# Patient Record
Sex: Female | Born: 1990 | Hispanic: No | Marital: Married | Smoking: Never smoker
Health system: Southern US, Community
[De-identification: ages and names within clinical notes are randomized; demographics above are authoritative.]

---

## 2013-09-10 ENCOUNTER — Encounter: Payer: Self-pay | Admitting: Obstetrics & Gynecology

## 2013-09-11 ENCOUNTER — Ambulatory Visit (INDEPENDENT_AMBULATORY_CARE_PROVIDER_SITE_OTHER): Payer: 59 | Admitting: Obstetrics & Gynecology

## 2013-09-11 ENCOUNTER — Encounter: Payer: Self-pay | Admitting: Obstetrics & Gynecology

## 2013-09-11 VITALS — BP 129/90 | HR 77 | Temp 98.0°F | Ht 66.0 in | Wt 125.0 lb

## 2013-09-11 DIAGNOSIS — N939 Abnormal uterine and vaginal bleeding, unspecified: Secondary | ICD-10-CM

## 2013-09-11 DIAGNOSIS — N949 Unspecified condition associated with female genital organs and menstrual cycle: Secondary | ICD-10-CM | POA: Insufficient documentation

## 2013-09-11 DIAGNOSIS — N926 Irregular menstruation, unspecified: Secondary | ICD-10-CM

## 2013-09-11 DIAGNOSIS — Z113 Encounter for screening for infections with a predominantly sexual mode of transmission: Secondary | ICD-10-CM

## 2013-09-11 NOTE — Progress Notes (Signed)
Subjective:     Kathy Hopkins is a 23 y.o. female here for a routine exam.  Current complaints: irregular menstrual cycle. [facility]. [facility]. Patient states her last period was irregular and lighter than normal. Patient states she is still currently bleeding and it is heavier. Patient states she is having some abdominal.  Personal health questionnaire reviewed: yes.   Gynecologic History Patient's last menstrual period was 08/21/2013. Contraception: none Last Pap: 2014. Results were: normal    The following portions of the patient's history were reviewed and updated as appropriate: allergies, current medications, past family history, past medical history, past social history, past surgical history and problem list.  Review of Systems Pertinent items are noted in HPI.   Objective:    BP 129/90  Pulse 77  Temp(Src) 98 F (36.7 C)  Ht 5\' 6"  (1.676 m)  Wt 125 lb (56.7 kg)  BMI 20.19 kg/m2  LMP 08/21/2013      General:  alert     Abdomen: soft, non-tender; bowel sounds normal; no masses,  no organomegaly   Vulva:  normal  Vagina: Scant menstrual blood  Cervix:  no lesions  Corpus: normal size, contour, position, consistency, mobility, non-tender  Adnexa:  normal adnexa   Informal U/S: normal uterus/ovaries Assessment:    AUB--likely O Pelvic pain--DDX--Mittelschmerz  Plan:   Orders Placed This Encounter  Procedures  . GC/Chlamydia Probe Amp  . B-HCG Quant  . TSH  . Prolactin  Keep menstrual calendar/diary Return in 2 months or if symptoms worsen

## 2013-09-11 NOTE — Patient Instructions (Signed)
  Abnormal Uterine Bleeding Abnormal uterine bleeding can affect women at various stages in life, including teenagers, women in their reproductive years, pregnant women, and women who have reached menopause. Several kinds of uterine bleeding are considered abnormal, including:  Bleeding or spotting between periods.   Bleeding after sexual intercourse.   Bleeding that is heavier or more than normal.   Periods that last longer than usual.  Bleeding after menopause.  Many cases of abnormal uterine bleeding are minor and simple to treat, while others are more serious. Any type of abnormal bleeding should be evaluated by your health care provider. Treatment will depend on the cause of the bleeding. HOME CARE INSTRUCTIONS Monitor your condition for any changes. The following actions may help to alleviate any discomfort you are experiencing:  Avoid the use of tampons and douches as directed by your health care provider.  Change your pads frequently. You should get regular pelvic exams and Pap tests. Keep all follow-up appointments for diagnostic tests as directed by your health care provider.  SEEK MEDICAL CARE IF:   Your bleeding lasts more than 1 week.   You feel dizzy at times.  SEEK IMMEDIATE MEDICAL CARE IF:   You pass out.   You are changing pads every 15 to 30 minutes.   You have abdominal pain.  You have a fever.   You become sweaty or weak.   You are passing large blood clots from the vagina.   You start to feel nauseous and vomit. MAKE SURE YOU:   Understand these instructions.  Will watch your condition.  Will get help right away if you are not doing well or get worse. Document Released: 07/31/2005 Document Revised: 04/02/2013 Document Reviewed: 02/27/2013 [facility] Patient Information 2014 [place], [place]. Place abnormal uterine bleeding patient instructions here.

## 2013-09-12 LAB — HCG, QUANTITATIVE, PREGNANCY: hCG, Beta Chain, Quant, S: <2 m[IU]/mL

## 2013-09-12 LAB — TSH: TSH: 0.99 u[IU]/mL (ref 0.350–4.500)

## 2013-09-12 LAB — GC/CHLAMYDIA PROBE AMP
CT PROBE, AMP APTIMA: NEGATIVE
GC Probe RNA: NEGATIVE

## 2013-09-12 LAB — PROLACTIN: PROLACTIN: 9.5 ng/mL

## 2013-11-06 ENCOUNTER — Ambulatory Visit: Payer: 59 | Admitting: Obstetrics & Gynecology

## 2014-08-10 ENCOUNTER — Encounter: Payer: Self-pay | Admitting: *Deleted

## 2014-08-11 ENCOUNTER — Encounter: Payer: Self-pay | Admitting: Obstetrics & Gynecology

## 2015-03-30 ENCOUNTER — Other Ambulatory Visit (HOSPITAL_COMMUNITY): Payer: Self-pay | Admitting: Obstetrics and Gynecology

## 2015-03-30 DIAGNOSIS — Z3169 Encounter for other general counseling and advice on procreation: Secondary | ICD-10-CM

## 2015-04-05 ENCOUNTER — Ambulatory Visit (HOSPITAL_COMMUNITY)
Admission: RE | Admit: 2015-04-05 | Discharge: 2015-04-05 | Disposition: A | Discharge status: Home / Self Care | Payer: 59 | Source: Ambulatory Visit | Attending: Obstetrics and Gynecology | Admitting: Obstetrics and Gynecology

## 2015-04-05 DIAGNOSIS — N979 Female infertility, unspecified: Secondary | ICD-10-CM | POA: Diagnosis not present

## 2015-04-05 DIAGNOSIS — Z3169 Encounter for other general counseling and advice on procreation: Secondary | ICD-10-CM

## 2015-04-05 MED ORDER — IOHEXOL 300 MG/ML  SOLN
30.0000 mL | Freq: Once | INTRAMUSCULAR | Status: DC | PRN
  Administered 2015-04-05: 30 mL
  Filled 2015-04-05: qty 30

## 2016-08-15 IMAGING — RF DG HYSTEROGRAM
5 series · 5 of 5 positions shown · IV contrast (omnipaque)
Comparison: None.

CLINICAL DATA: Primary infertility.

EXAM:
HYSTEROSALPINGOGRAM
TECHNIQUE: Following cleansing of the cervix and vagina with Betadine solution,
a hysterosalpingogram was performed using a 5-French
hysterosalpingogram catheter and Omnipaque 300 contrast. The patient
tolerated the examination without difficulty.

[Series 1: run · 1 of 1 slices shown (1 of 5)]
[im 1/1]
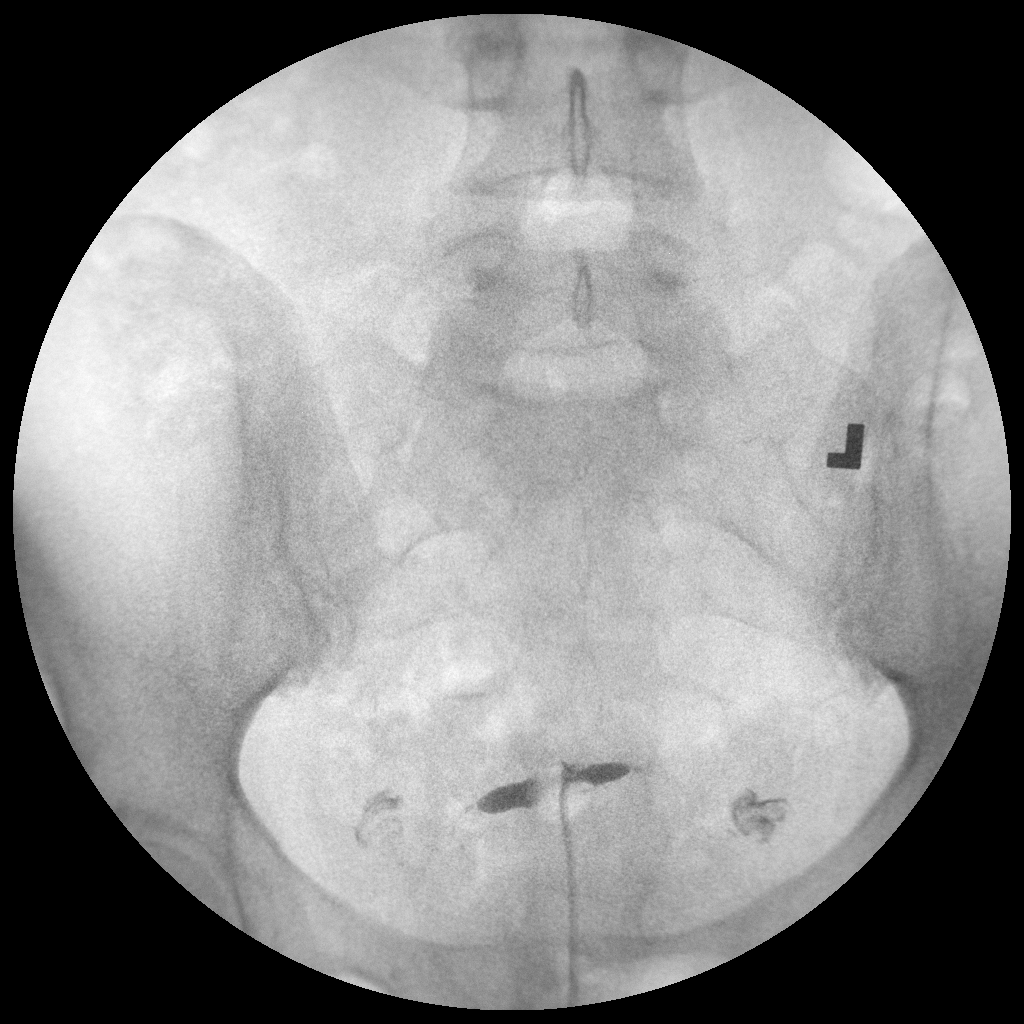

[Series 2: run · 1 of 1 slices shown (2 of 5)]
[im 1/1]
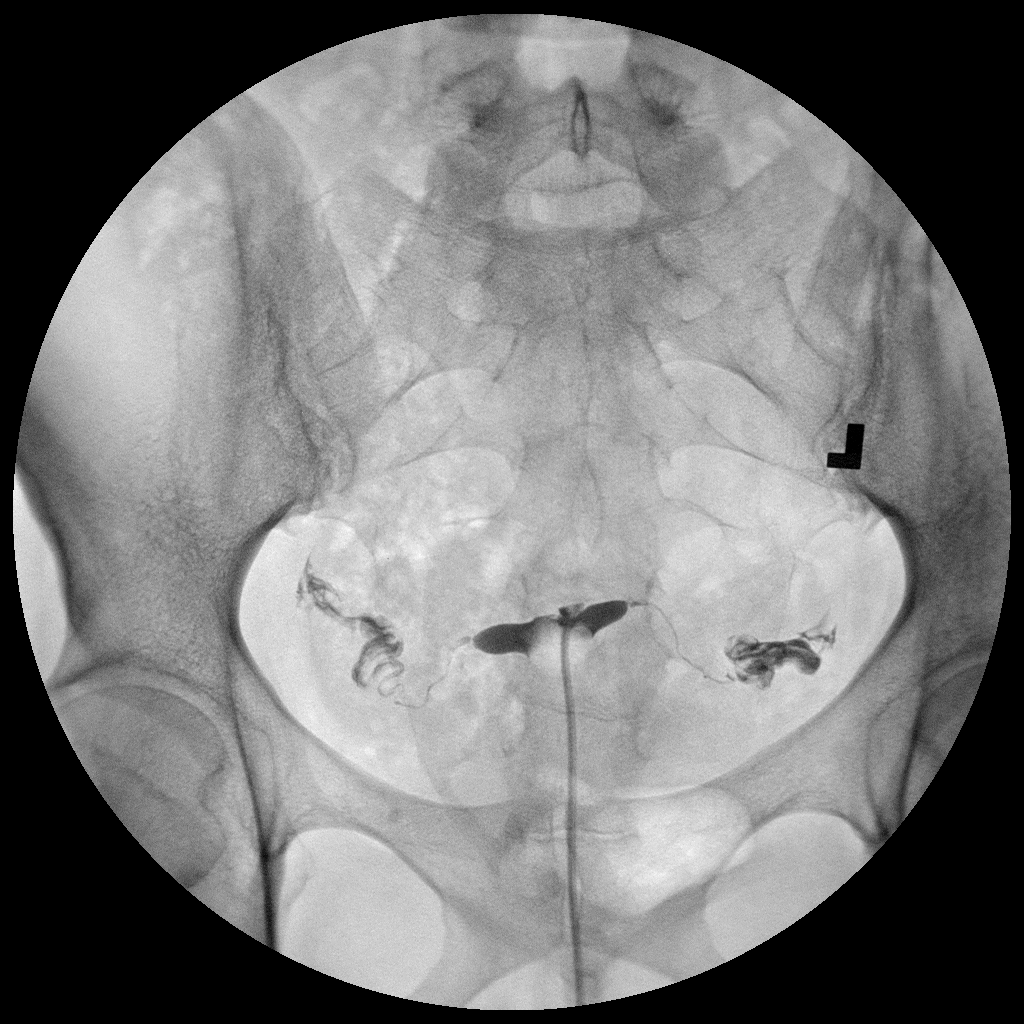

[Series 3: run · 1 of 1 slices shown (3 of 5)]
[im 1/1]
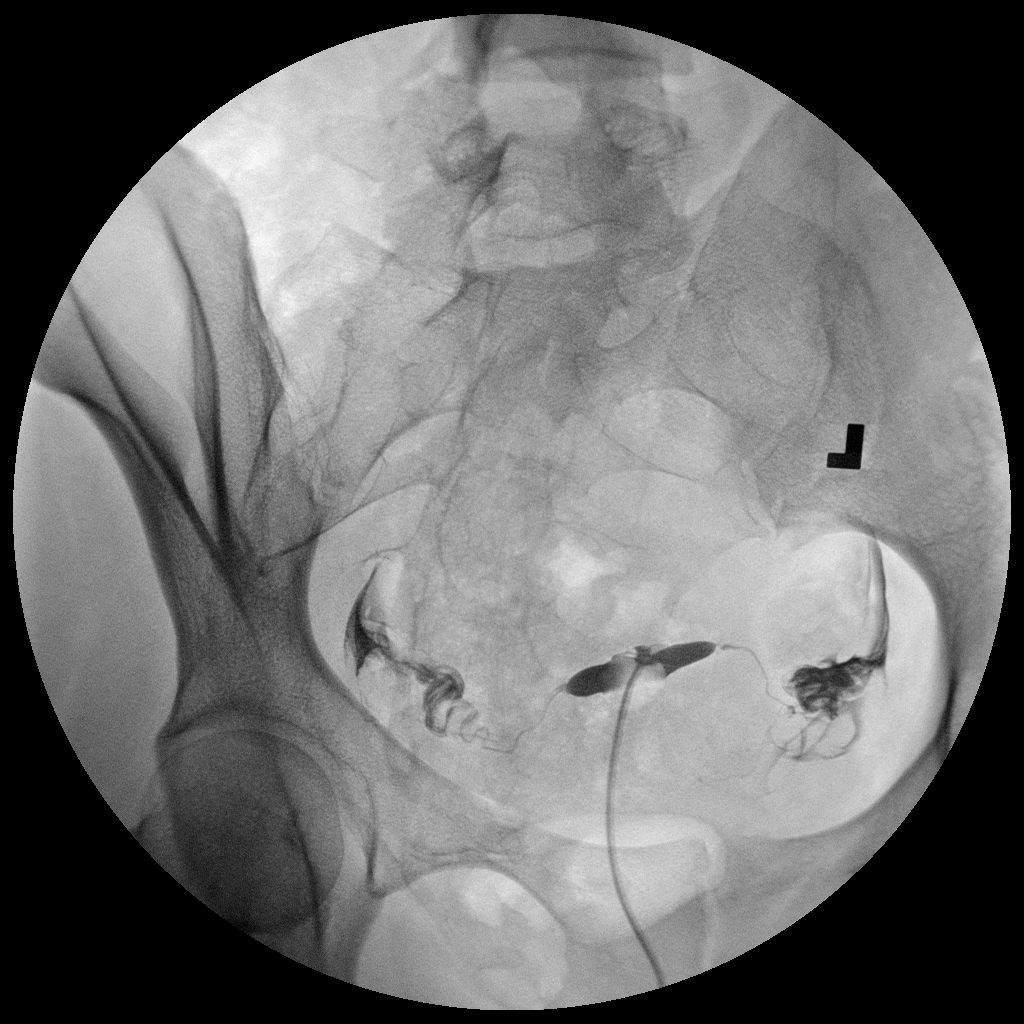

[Series 4: run · 1 of 1 slices shown (4 of 5)]
[im 1/1]
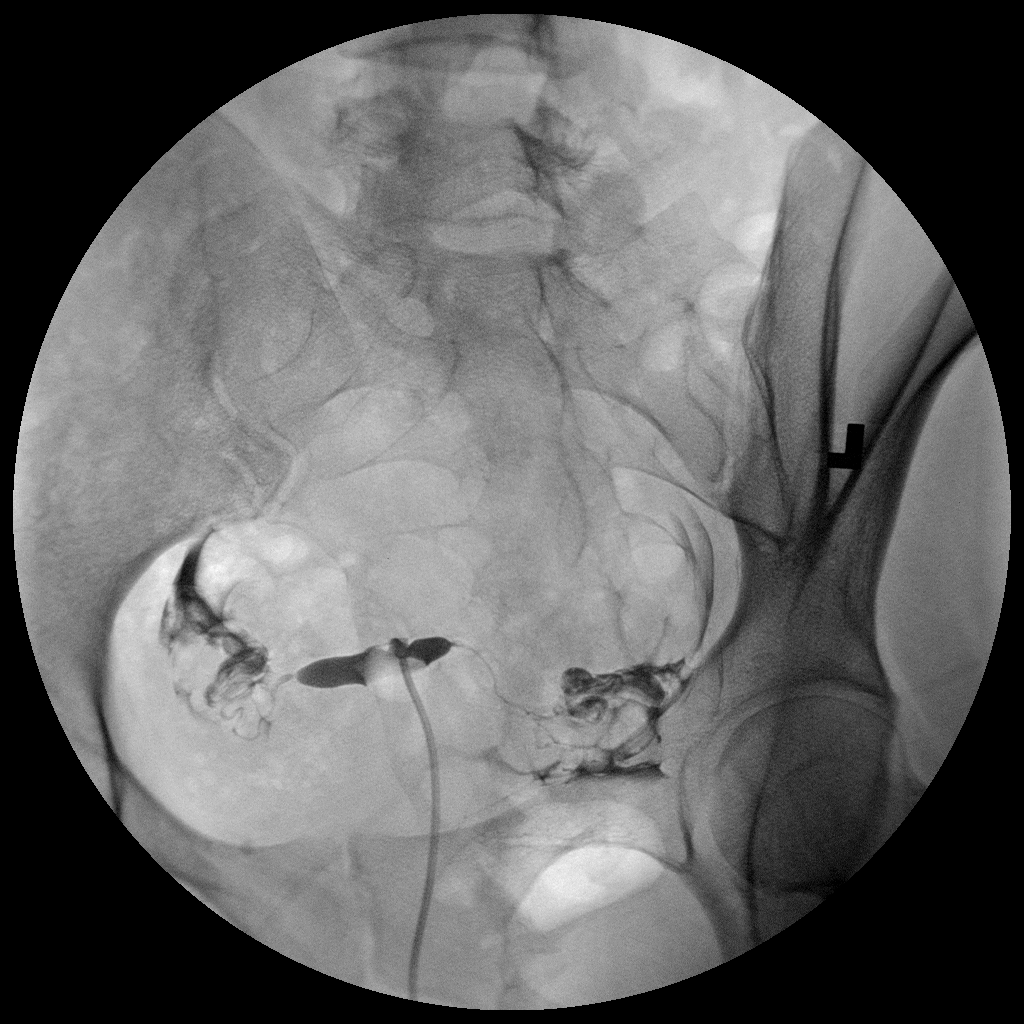

[Series 5: run · 1 of 1 slices shown (5 of 5)]
[im 1/1]
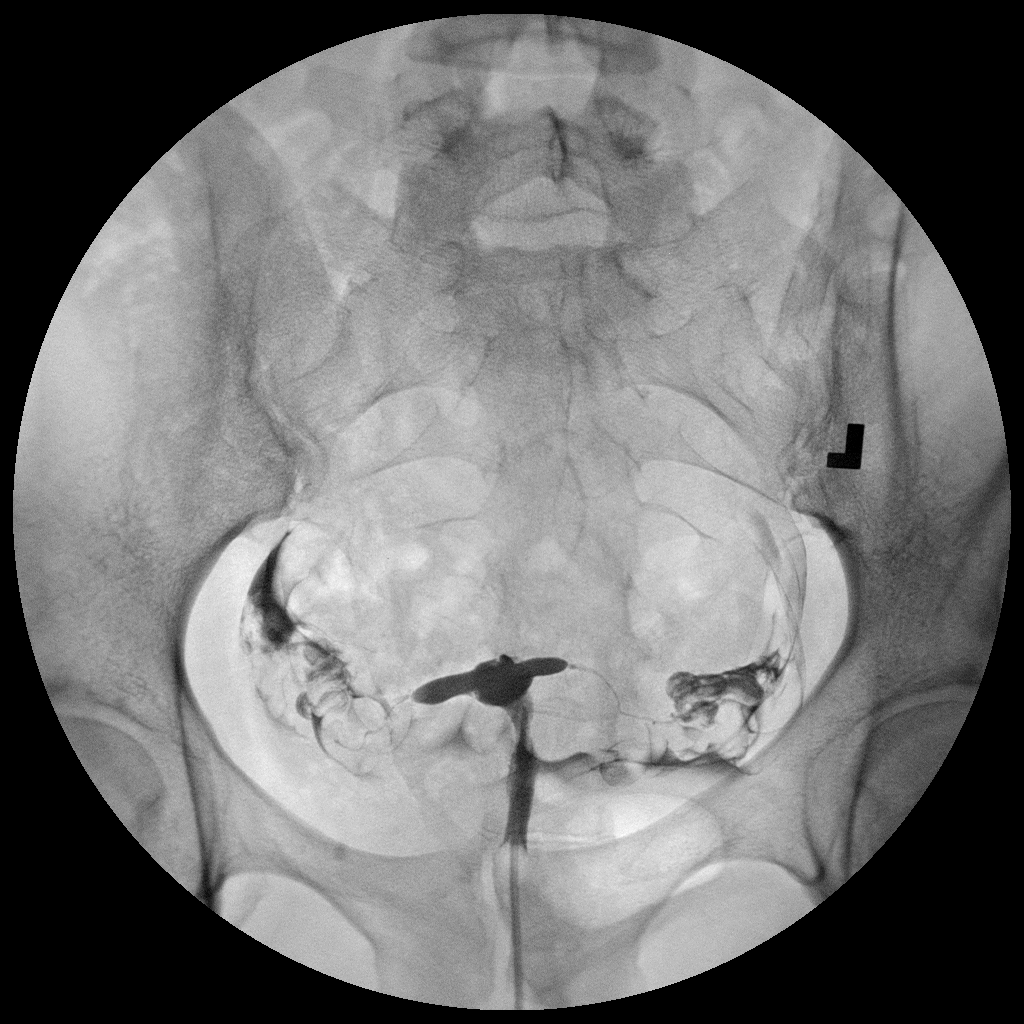

[5 of 5 positions shown; findings below may reference images not displayed]

FLUOROSCOPY TIME:  Fluoroscopy Time:  0 minutes 36 seconds

Number of Acquired Images:  5
FINDINGS: Endometrial Cavity: Normal appearance. No signs of Mullerian duct
anomaly or other significant abnormality.

Right Fallopian Tube: Well opacified and normal in appearance. Free
intraperitoneal spill of contrast is demonstrated.

Left Fallopian Tube: Well opacified and normal in appearance. Free
intraperitoneal spill of contrast is demonstrated.

Other:  None.
IMPRESSION: Normal study.  Patency of both fallopian tubes demonstrated.
